# Patient Record
Sex: Male | Born: 1970 | Race: White | Hispanic: No | Marital: Married | Smoking: Never smoker
Health system: Southern US, Community
[De-identification: ages and names within clinical notes are randomized; demographics above are authoritative.]

## PROBLEM LIST (undated history)

## (undated) DIAGNOSIS — Z8719 Personal history of other diseases of the digestive system: Secondary | ICD-10-CM

## (undated) DIAGNOSIS — K219 Gastro-esophageal reflux disease without esophagitis: Secondary | ICD-10-CM

---

## 2006-11-04 ENCOUNTER — Emergency Department (HOSPITAL_COMMUNITY): Admission: EM | Admit: 2006-11-04 | Discharge: 2006-11-04 | Payer: Self-pay | Admitting: Emergency Medicine

## 2010-05-04 ENCOUNTER — Encounter: Admission: RE | Admit: 2010-05-04 | Discharge: 2010-05-04 | Payer: Self-pay | Admitting: Family Medicine

## 2013-07-07 ENCOUNTER — Observation Stay (HOSPITAL_COMMUNITY)
Admission: EM | Admit: 2013-07-07 | Discharge: 2013-07-08 | Disposition: A | Discharge status: Home / Self Care | Payer: Worker's Compensation | Attending: Orthopedic Surgery | Admitting: Orthopedic Surgery

## 2013-07-07 ENCOUNTER — Encounter (HOSPITAL_COMMUNITY): Payer: Worker's Compensation | Admitting: Anesthesiology

## 2013-07-07 ENCOUNTER — Encounter: Payer: Self-pay | Admitting: Family Medicine

## 2013-07-07 ENCOUNTER — Encounter (HOSPITAL_COMMUNITY): Payer: Self-pay | Admitting: Emergency Medicine

## 2013-07-07 ENCOUNTER — Ambulatory Visit (INDEPENDENT_AMBULATORY_CARE_PROVIDER_SITE_OTHER): Payer: BC Managed Care – PPO | Admitting: Family Medicine

## 2013-07-07 ENCOUNTER — Emergency Department (HOSPITAL_COMMUNITY): Payer: Worker's Compensation | Admitting: Anesthesiology

## 2013-07-07 ENCOUNTER — Encounter (HOSPITAL_COMMUNITY): Admission: EM | Disposition: A | Discharge status: Home / Self Care | Payer: Self-pay | Source: Home / Self Care

## 2013-07-07 ENCOUNTER — Ambulatory Visit: Payer: BC Managed Care – PPO

## 2013-07-07 VITALS — BP 146/88 | HR 86 | Temp 98.6°F | Resp 18 | Ht 74.0 in | Wt 372.0 lb

## 2013-07-07 DIAGNOSIS — S62609B Fracture of unspecified phalanx of unspecified finger, initial encounter for open fracture: Secondary | ICD-10-CM

## 2013-07-07 DIAGNOSIS — T148XXA Other injury of unspecified body region, initial encounter: Secondary | ICD-10-CM | POA: Insufficient documentation

## 2013-07-07 DIAGNOSIS — S6292XB Unspecified fracture of left wrist and hand, initial encounter for open fracture: Secondary | ICD-10-CM

## 2013-07-07 DIAGNOSIS — IMO0002 Reserved for concepts with insufficient information to code with codable children: Principal | ICD-10-CM | POA: Insufficient documentation

## 2013-07-07 DIAGNOSIS — W208XXA Other cause of strike by thrown, projected or falling object, initial encounter: Secondary | ICD-10-CM | POA: Insufficient documentation

## 2013-07-07 DIAGNOSIS — Z23 Encounter for immunization: Secondary | ICD-10-CM

## 2013-07-07 DIAGNOSIS — S61409A Unspecified open wound of unspecified hand, initial encounter: Secondary | ICD-10-CM | POA: Insufficient documentation

## 2013-07-07 DIAGNOSIS — K219 Gastro-esophageal reflux disease without esophagitis: Secondary | ICD-10-CM | POA: Insufficient documentation

## 2013-07-07 DIAGNOSIS — S6722XA Crushing injury of left hand, initial encounter: Secondary | ICD-10-CM

## 2013-07-07 DIAGNOSIS — K449 Diaphragmatic hernia without obstruction or gangrene: Secondary | ICD-10-CM | POA: Insufficient documentation

## 2013-07-07 DIAGNOSIS — S6720XA Crushing injury of unspecified hand, initial encounter: Secondary | ICD-10-CM

## 2013-07-07 HISTORY — DX: Gastro-esophageal reflux disease without esophagitis: K21.9

## 2013-07-07 HISTORY — DX: Personal history of other diseases of the digestive system: Z87.19

## 2013-07-07 HISTORY — PX: PERCUTANEOUS PINNING: SHX2209

## 2013-07-07 HISTORY — PX: I&D EXTREMITY: SHX5045

## 2013-07-07 SURGERY — PINNING, EXTREMITY, PERCUTANEOUS
Anesthesia: General | Site: Hand | Laterality: Left | Wound class: Dirty or Infected

## 2013-07-07 MED ORDER — VITAMIN C 500 MG PO TABS
1000.0000 mg | ORAL_TABLET | Freq: Every day | ORAL | Status: DC
  Administered 2013-07-07 – 2013-07-08 (×2): 1000 mg via ORAL
  Filled 2013-07-07 (×2): qty 2

## 2013-07-07 MED ORDER — CEFAZOLIN SODIUM 1-5 GM-% IV SOLN
INTRAVENOUS | Status: AC
  Filled 2013-07-07: qty 100

## 2013-07-07 MED ORDER — PANTOPRAZOLE SODIUM 40 MG PO TBEC: 40.0000 mg | DELAYED_RELEASE_TABLET | Freq: Two times a day (BID) | ORAL | Status: DC | PRN

## 2013-07-07 MED ORDER — SENNA 8.6 MG PO TABS
1.0000 | ORAL_TABLET | Freq: Two times a day (BID) | ORAL | Status: DC
  Administered 2013-07-07 – 2013-07-08 (×2): 8.6 mg via ORAL
  Filled 2013-07-07 (×3): qty 1

## 2013-07-07 MED ORDER — METHOCARBAMOL 500 MG PO TABS
ORAL_TABLET | ORAL | Status: AC
  Filled 2013-07-07: qty 1

## 2013-07-07 MED ORDER — ONDANSETRON HCL 4 MG PO TABS: 4.0000 mg | ORAL_TABLET | Freq: Four times a day (QID) | ORAL | Status: DC | PRN

## 2013-07-07 MED ORDER — LACTATED RINGERS IV SOLN
INTRAVENOUS | Status: DC
  Administered 2013-07-07: 20:00:00 via INTRAVENOUS

## 2013-07-07 MED ORDER — DIPHENHYDRAMINE HCL 25 MG PO CAPS: 25.0000 mg | ORAL_CAPSULE | Freq: Four times a day (QID) | ORAL | Status: DC | PRN

## 2013-07-07 MED ORDER — CEFAZOLIN SODIUM-DEXTROSE 2-3 GM-% IV SOLR
INTRAVENOUS | Status: DC | PRN
  Administered 2013-07-07: 2 g via INTRAVENOUS

## 2013-07-07 MED ORDER — LACTATED RINGERS IV SOLN
INTRAVENOUS | Status: DC | PRN
  Administered 2013-07-07: 18:00:00 via INTRAVENOUS

## 2013-07-07 MED ORDER — OXYCODONE HCL 5 MG PO TABS
ORAL_TABLET | ORAL | Status: AC
  Filled 2013-07-07: qty 1

## 2013-07-07 MED ORDER — MIDAZOLAM HCL 5 MG/5ML IJ SOLN
INTRAMUSCULAR | Status: DC | PRN
  Administered 2013-07-07: 2 mg via INTRAVENOUS

## 2013-07-07 MED ORDER — ONDANSETRON HCL 4 MG/2ML IJ SOLN: 4.0000 mg | Freq: Four times a day (QID) | INTRAMUSCULAR | Status: DC | PRN

## 2013-07-07 MED ORDER — LIDOCAINE HCL (CARDIAC) 20 MG/ML IV SOLN
INTRAVENOUS | Status: DC | PRN
  Administered 2013-07-07: 100 mg via INTRAVENOUS

## 2013-07-07 MED ORDER — ONDANSETRON HCL 4 MG/2ML IJ SOLN
INTRAMUSCULAR | Status: DC | PRN
  Administered 2013-07-07: 4 mg via INTRAVENOUS

## 2013-07-07 MED ORDER — METHOCARBAMOL 100 MG/ML IJ SOLN
500.0000 mg | Freq: Four times a day (QID) | INTRAVENOUS | Status: DC | PRN
  Filled 2013-07-07: qty 5

## 2013-07-07 MED ORDER — CEFAZOLIN SODIUM 1-5 GM-% IV SOLN
1.0000 g | INTRAVENOUS | Status: DC
  Administered 2013-07-07: 1 g via INTRAVENOUS

## 2013-07-07 MED ORDER — OXYCODONE HCL 5 MG PO TABS
5.0000 mg | ORAL_TABLET | ORAL | Status: DC | PRN
  Administered 2013-07-07: 5 mg via ORAL
  Administered 2013-07-08 (×3): 10 mg via ORAL
  Filled 2013-07-07 (×3): qty 2

## 2013-07-07 MED ORDER — PROPOFOL 10 MG/ML IV BOLUS
INTRAVENOUS | Status: DC | PRN
  Administered 2013-07-07: 100 mg via INTRAVENOUS
  Administered 2013-07-07: 200 mg via INTRAVENOUS

## 2013-07-07 MED ORDER — FENTANYL CITRATE 0.05 MG/ML IJ SOLN
INTRAMUSCULAR | Status: DC | PRN
  Administered 2013-07-07: 50 ug via INTRAVENOUS
  Administered 2013-07-07: 25 ug via INTRAVENOUS
  Administered 2013-07-07: 50 ug via INTRAVENOUS
  Administered 2013-07-07: 75 ug via INTRAVENOUS
  Administered 2013-07-07: 50 ug via INTRAVENOUS

## 2013-07-07 MED ORDER — SODIUM CHLORIDE 0.9 % IR SOLN
Status: DC | PRN
  Administered 2013-07-07: 1000 mL

## 2013-07-07 MED ORDER — HYDROMORPHONE HCL PF 1 MG/ML IJ SOLN
INTRAMUSCULAR | Status: AC
  Filled 2013-07-07: qty 1

## 2013-07-07 MED ORDER — MORPHINE SULFATE 2 MG/ML IJ SOLN
1.0000 mg | INTRAMUSCULAR | Status: DC | PRN
  Administered 2013-07-07: 1 mg via INTRAVENOUS
  Filled 2013-07-07: qty 1

## 2013-07-07 MED ORDER — METHOCARBAMOL 500 MG PO TABS
500.0000 mg | ORAL_TABLET | Freq: Four times a day (QID) | ORAL | Status: DC | PRN
  Administered 2013-07-07 – 2013-07-08 (×2): 500 mg via ORAL
  Filled 2013-07-07: qty 1

## 2013-07-07 MED ORDER — HYDROMORPHONE HCL PF 1 MG/ML IJ SOLN
0.2500 mg | INTRAMUSCULAR | Status: DC | PRN
  Administered 2013-07-07 (×4): 0.5 mg via INTRAVENOUS

## 2013-07-07 MED ORDER — LACTATED RINGERS IV SOLN
INTRAVENOUS | Status: DC
  Administered 2013-07-07: 18:00:00 via INTRAVENOUS

## 2013-07-07 MED ORDER — TEMAZEPAM 15 MG PO CAPS: 15.0000 mg | ORAL_CAPSULE | Freq: Every evening | ORAL | Status: DC | PRN

## 2013-07-07 MED ORDER — HYDROMORPHONE HCL PF 1 MG/ML IJ SOLN
INTRAMUSCULAR | Status: AC
Start: 2013-07-07 — End: 2013-07-08
  Filled 2013-07-07: qty 1

## 2013-07-07 MED ORDER — ALPRAZOLAM 0.5 MG PO TABS: 0.5000 mg | ORAL_TABLET | Freq: Four times a day (QID) | ORAL | Status: DC | PRN

## 2013-07-07 MED ORDER — CEFAZOLIN SODIUM 1-5 GM-% IV SOLN
1.0000 g | Freq: Three times a day (TID) | INTRAVENOUS | Status: DC
  Administered 2013-07-08 (×2): 1 g via INTRAVENOUS
  Filled 2013-07-07 (×6): qty 50

## 2013-07-07 MED ORDER — SODIUM CHLORIDE 0.9 % IR SOLN
Status: DC | PRN
  Administered 2013-07-07: 4000 mL

## 2013-07-07 SURGICAL SUPPLY — 61 items
BANDAGE CONFORM 2  STR LF (GAUZE/BANDAGES/DRESSINGS) IMPLANT
BANDAGE CONFORM 3  STR LF (GAUZE/BANDAGES/DRESSINGS) ×2 IMPLANT
BANDAGE ELASTIC 3 VELCRO ST LF (GAUZE/BANDAGES/DRESSINGS) IMPLANT
BANDAGE ELASTIC 4 VELCRO ST LF (GAUZE/BANDAGES/DRESSINGS) ×2 IMPLANT
BANDAGE GAUZE ELAST BULKY 4 IN (GAUZE/BANDAGES/DRESSINGS) ×2 IMPLANT
BENZOIN TINCTURE PRP APPL 2/3 (GAUZE/BANDAGES/DRESSINGS) IMPLANT
BLADE SURG ROTATE 9660 (MISCELLANEOUS) IMPLANT
CLOTH BEACON ORANGE TIMEOUT ST (SAFETY) IMPLANT
CORDS BIPOLAR (ELECTRODE) ×2 IMPLANT
COVER SURGICAL LIGHT HANDLE (MISCELLANEOUS) ×2 IMPLANT
CUFF TOURNIQUET SINGLE 18IN (TOURNIQUET CUFF) IMPLANT
CUFF TOURNIQUET SINGLE 24IN (TOURNIQUET CUFF) ×2 IMPLANT
DRAPE OEC MINIVIEW 54X84 (DRAPES) ×2 IMPLANT
DRSG ADAPTIC 3X8 NADH LF (GAUZE/BANDAGES/DRESSINGS) ×2 IMPLANT
DRSG EMULSION OIL 3X3 NADH (GAUZE/BANDAGES/DRESSINGS) IMPLANT
ELECT REM PT RETURN 9FT ADLT (ELECTROSURGICAL)
ELECTRODE REM PT RTRN 9FT ADLT (ELECTROSURGICAL) IMPLANT
GAUZE XEROFORM 1X8 LF (GAUZE/BANDAGES/DRESSINGS) IMPLANT
GAUZE XEROFORM 5X9 LF (GAUZE/BANDAGES/DRESSINGS) ×2 IMPLANT
GLOVE BIOGEL M STRL SZ7.5 (GLOVE) ×2 IMPLANT
GLOVE BIOGEL PI IND STRL 7.0 (GLOVE) ×1 IMPLANT
GLOVE BIOGEL PI INDICATOR 7.0 (GLOVE) ×1
GLOVE ECLIPSE 6.5 STRL STRAW (GLOVE) ×2 IMPLANT
GLOVE SS BIOGEL STRL SZ 8 (GLOVE) ×1 IMPLANT
GLOVE SUPERSENSE BIOGEL SZ 8 (GLOVE) ×1
GOWN PREVENTION PLUS XLARGE (GOWN DISPOSABLE) IMPLANT
GOWN STRL NON-REIN LRG LVL3 (GOWN DISPOSABLE) IMPLANT
GOWN STRL REIN XL XLG (GOWN DISPOSABLE) ×4 IMPLANT
HANDPIECE INTERPULSE COAX TIP (DISPOSABLE)
IV NS 1000ML (IV SOLUTION) ×4
IV NS 1000ML BAXH (IV SOLUTION) ×4 IMPLANT
K-WIRE DBL TROCAR .045X4 ×4 IMPLANT
KIT BASIN OR (CUSTOM PROCEDURE TRAY) ×2 IMPLANT
KIT ROOM TURNOVER OR (KITS) ×2 IMPLANT
KWIRE DBL TROCAR .045X4 ×2 IMPLANT
MANIFOLD NEPTUNE II (INSTRUMENTS) IMPLANT
NEEDLE HYPO 25GX1X1/2 BEV (NEEDLE) IMPLANT
NS IRRIG 1000ML POUR BTL (IV SOLUTION) ×2 IMPLANT
PACK ORTHO EXTREMITY (CUSTOM PROCEDURE TRAY) ×2 IMPLANT
PAD ARMBOARD 7.5X6 YLW CONV (MISCELLANEOUS) ×4 IMPLANT
PAD CAST 4YDX4 CTTN HI CHSV (CAST SUPPLIES) ×1 IMPLANT
PADDING CAST COTTON 4X4 STRL (CAST SUPPLIES) ×1
SET CYSTO W/LG BORE CLAMP LF (SET/KITS/TRAYS/PACK) ×2 IMPLANT
SET HNDPC FAN SPRY TIP SCT (DISPOSABLE) IMPLANT
SPONGE GAUZE 4X4 12PLY (GAUZE/BANDAGES/DRESSINGS) ×2 IMPLANT
SPONGE LAP 18X18 X RAY DECT (DISPOSABLE) IMPLANT
SPONGE LAP 4X18 X RAY DECT (DISPOSABLE) IMPLANT
STRIP CLOSURE SKIN 1/2X4 (GAUZE/BANDAGES/DRESSINGS) IMPLANT
SUT CHROMIC 4 0 P 3 18 (SUTURE) ×2 IMPLANT
SUT CHROMIC 5 0 P 3 (SUTURE) ×2 IMPLANT
SUT ETHILON 4 0 P 3 18 (SUTURE) IMPLANT
SUT ETHILON 5 0 P 3 18 (SUTURE)
SUT NYLON ETHILON 5-0 P-3 1X18 (SUTURE) IMPLANT
SUT PROLENE 4 0 P 3 18 (SUTURE) ×2 IMPLANT
SYR CONTROL 10ML LL (SYRINGE) IMPLANT
TOWEL OR 17X24 6PK STRL BLUE (TOWEL DISPOSABLE) ×2 IMPLANT
TOWEL OR 17X26 10 PK STRL BLUE (TOWEL DISPOSABLE) ×2 IMPLANT
TUBE ANAEROBIC SPECIMEN COL (MISCELLANEOUS) IMPLANT
TUBE CONNECTING 12X1/4 (SUCTIONS) ×2 IMPLANT
WATER STERILE IRR 1000ML POUR (IV SOLUTION) ×2 IMPLANT
YANKAUER SUCT BULB TIP NO VENT (SUCTIONS) ×2 IMPLANT

## 2013-07-07 NOTE — Anesthesia Postprocedure Evaluation (Signed)
Anesthesia Post Note  Patient: Kirk Miles  Procedure(s) Performed: Procedure(s) (LRB): Irrigation and Debridement Left Small Finger with Pinning and Possible ORIF (Left) IRRIGATION AND DEBRIDEMENT EXTREMITY (Left)  Anesthesia type: general  Patient location: PACU  Post pain: Pain level controlled  Post assessment: Patient's Cardiovascular Status Stable  Last Vitals:  Filed Vitals:   07/07/13 2015  BP: 138/76  Pulse: 86  Temp:   Resp: 13    Post vital signs: Reviewed and stable  Level of consciousness: sedated  Complications: No apparent anesthesia complications

## 2013-07-07 NOTE — Anesthesia Procedure Notes (Signed)
Procedure Name: LMA Insertion Date/Time: 07/07/2013 6:46 PM Performed by: Molli Hazard Pre-anesthesia Checklist: Patient identified, Emergency Drugs available, Suction available and Patient being monitored Patient Re-evaluated:Patient Re-evaluated prior to inductionOxygen Delivery Method: Circle system utilized Preoxygenation: Pre-oxygenation with 100% oxygen Intubation Type: IV induction LMA: LMA inserted LMA Size: 5.0 Number of attempts: 1 Placement Confirmation: positive ETCO2 Tube secured with: Tape Dental Injury: Teeth and Oropharynx as per pre-operative assessment

## 2013-07-07 NOTE — Anesthesia Preprocedure Evaluation (Addendum)
Anesthesia Evaluation  Patient identified by MRN, date of birth, ID band Patient awake    Reviewed: Allergy & Precautions, H&P , NPO status , Patient's Chart, lab work & pertinent test results  Airway Mallampati: II TM Distance: >3 FB Neck ROM: Full    Dental  (+) Dental Advisory Given and Teeth Intact   Pulmonary neg pulmonary ROS,  breath sounds clear to auscultation        Cardiovascular negative cardio ROS  Rhythm:Regular Rate:Normal     Neuro/Psych    GI/Hepatic Neg liver ROS, hiatal hernia, GERD-  ,  Endo/Other  negative endocrine ROS  Renal/GU negative Renal ROS     Musculoskeletal   Abdominal   Peds  Hematology   Anesthesia Other Findings   Reproductive/Obstetrics                          Anesthesia Physical Anesthesia Plan  ASA: II  Anesthesia Plan: General   Post-op Pain Management:    Induction: Intravenous  Airway Management Planned: Oral ETT and LMA  Additional Equipment:   Intra-op Plan:   Post-operative Plan: Extubation in OR  Informed Consent: I have reviewed the patients History and Physical, chart, labs and discussed the procedure including the risks, benefits and alternatives for the proposed anesthesia with the patient or authorized representative who has indicated his/her understanding and acceptance.   Dental advisory given  Plan Discussed with: CRNA and Anesthesiologist  Anesthesia Plan Comments:        Anesthesia Quick Evaluation

## 2013-07-07 NOTE — Transfer of Care (Signed)
Immediate Anesthesia Transfer of Care Note  Patient: Kirk Miles  Procedure(s) Performed: Procedure(s): Irrigation and Debridement Left Small Finger with Pinning and Possible ORIF (Left) IRRIGATION AND DEBRIDEMENT EXTREMITY (Left)  Patient Location: PACU  Anesthesia Type:General  Level of Consciousness: awake, alert  and oriented  Airway & Oxygen Therapy: Patient connected to nasal cannula oxygen  Post-op Assessment: Report given to PACU RN, Post -op Vital signs reviewed and stable and Patient moving all extremities X 4  Post vital signs: Reviewed and stable  Complications: No apparent anesthesia complications

## 2013-07-07 NOTE — Patient Instructions (Signed)
Dr. Amanda Pea and or his PA will see you in the emergency room at [place].

## 2013-07-07 NOTE — H&P (Signed)
Kirk Miles is an 42 y.o. male.   Chief Complaint: Comminuted crushing injury to left small finger HPI: Patient presents status post crush injury left small finger with open fracture pattern. The patient had tremendous blood loss and skin tearing. He has been numb but prior to his emergency visit and the administration of anesthetic he states he can feel the finger.  He notes no other injury. This was not job injury. The a patient states a computer his hands he was trying to lift a box. He notes no other injury. His delightful gentleman. I've asked to see him before in the office 10 years ago for a issue on his opposite extremity.  Kirk Miles.Patient presents for evaluation and treatment of the of their upper extremity predicament. The patient denies neck back chest or of abdominal pain. The patient notes that they have no lower extremity problems. The patient from primarily complains of the upper extremity pain noted.  Past Medical History  Diagnosis Date  . GERD (gastroesophageal reflux disease)   . H/O hiatal hernia     History reviewed. No pertinent past surgical history.  No family history on file. Social History:  reports that he has never smoked. He does not have any smokeless tobacco history on file. He reports that he does not drink alcohol. His drug history is not on file.  Allergies: No Known Allergies  Medications Prior to Admission  Medication Sig Dispense Refill  . ibuprofen (ADVIL,MOTRIN) 200 MG tablet Take 400 mg by mouth every 6 (six) hours as needed for moderate pain.      Kirk Miles omeprazole (PRILOSEC) 20 MG capsule Take 20 mg by mouth daily.        No results found for this or any previous visit (from the past 48 hour(s)). Dg Hand Complete Left  07/07/2013   CLINICAL DATA:  Hand crushed between 2 computers  EXAM: LEFT HAND - COMPLETE 3+ VIEW  COMPARISON:  None.  FINDINGS: Obliquely oriented and a mildly comminuted fracture through the base of the proximal phalanx of the little  finger. And nondisplaced fracture line extends to the proximal articular surface. The remainder the visualized bones and joints are intact and unremarkable. There is associated soft tissue swelling.  IMPRESSION: Mildly comminuted and displaced fracture through the base the of the proximal phalanx of the little finger with intra-articular extension.   Electronically Signed   By: Malachy Moan M.D.   On: 07/07/2013 15:25    Review of Systems  Constitutional: Negative.   HENT: Negative.   Eyes: Negative.   Respiratory: Negative.   Cardiovascular: Negative.   Gastrointestinal: Negative.   Genitourinary: Negative.   Neurological: Negative.   Endo/Heme/Allergies: Negative.   Psychiatric/Behavioral: Negative.     Blood pressure 151/86, pulse 100, temperature 98.6 F (37 C), temperature source Oral, resp. rate 18, SpO2 96.00%. Physical Exam   laceration fourth webspace left hand with fracture about the proximal phalanx left small finger. FDP is intact. His finger feels numb secondary to administration of local anesthetic by the emergency room department. The patient has intact refill. He is no other injury to the wrist forearm or elbow. His right upper extremity is neurovascular intact with IV access.  His x-rays are reviewed which show a comminuted fracture about the left small finger proximal phalanx  .Kirk KitchenThe patient is alert and oriented in no acute distress the patient complains of pain in the affected upper extremity.  The patient is noted to have a normal HEENT exam.  Lung fields  show equal chest expansion and no shortness of breath  abdomen exam is nontender without distention.  Lower extremity examination does not show any fracture dislocation or blood clot symptoms.  Pelvis is stable neck and back are stable and nontender Assessment/Plan DISPLACED OPEN LEFT SMALL FINGER PROXIMAL PHALANX FRACTURE. WE WILL PLAN IRRIGATION DEBRIDEMENT REPAIR IS NECESSARY. HE UNDERSTANDS RISK AND  BENEFITS AND DESIRES TO PROCEED.   Kirk Miles.We are planning surgery for your upper extremity. The risk and benefits of surgery include risk of bleeding infection anesthesia damage to normal structures and failure of the surgery to accomplish its intended goals of relieving symptoms and restoring function with this in mind we'll going to proceed. I have specifically discussed with the patient the pre-and postoperative regime and the does and don'ts and risk and benefits in great detail. Risk and benefits of surgery also include risk of dystrophy chronic nerve pain failure of the healing process to go onto completion and other inherent risks of surgery The relavent the pathophysiology of the disease/injury process, as well as the alternatives for treatment and postoperative course of action has been discussed in great detail with the patient who desires to proceed.  We will do everything in our power to help you (the patient) restore function to the upper extremity. Is a pleasure to see this patient today.   Karen Chafe 07/07/2013, 6:19 PM

## 2013-07-07 NOTE — Progress Notes (Signed)
Health Hx. Discussed with Dr. Randa Evens, no labs needed.

## 2013-07-07 NOTE — ED Notes (Signed)
Pt has laceration to left hand.  He went to [facility] and was going to get sutured but it was found that he also has a fx so he was sent here for further evaluation.  BLeeding controlled at this time

## 2013-07-07 NOTE — ED Notes (Signed)
Pt transported to OR

## 2013-07-07 NOTE — Op Note (Signed)
See dictation #[id number] Wendall Stade Md

## 2013-07-07 NOTE — Preoperative (Signed)
Beta Blockers   Reason not to administer Beta Blockers:Not Applicable 

## 2013-07-07 NOTE — Progress Notes (Signed)
Subjective: Patient was lifting a computer out of a BN of old computers and another heavy computer fell on his left hand, crushing it. He is able to work it loose, but had a bad tear of the tissue between the fourth and fifth fingers in the web space. He is overdue for a tetanus shot.  Generally he has been a healthy person. He knows he is overweight, otherwise no major health concerns.  Objective: Large white male in some discomfort. Flexion extension of the left fifth finger is good with good strength. However he is very painful in the area of the wound. He has a split between the fourth and fifth fingers down in the webspace. It appears that he is almost one finger off to get that wound.  Ring was removed from the fourth finger.   The wound was anesthetized with a metacarpal block to the finger, and he was sent for x-ray.  [facility] reading (PRIMARY) by  Dr. Alwyn Ren Impacted and displaced fracture of proximal first phalynx of fifth finger, probably involving joint..  Assessment: Open fracture proximal first phalanx of fifth finger Hand pain  Plan: Reached Dr. Amanda Pea through an operating room nurse. The patient will be set to cut emergency room.

## 2013-07-07 NOTE — ED Provider Notes (Signed)
CSN: [id number]     Arrival date & time 07/07/13  1536 History  This chart was scribed for non-physician practitioner working [name], [place],  with Derwood Kaplan, MD by Ronal Fear, ED scribe. This patient was seen in room TR06C/TR06C and the patient's care was started at 4:15 PM.    Chief Complaint  Patient presents with  . Extremity Laceration   Patient is a 42 y.o. male presenting with skin laceration. The history is provided by the patient. No language interpreter was used.  Laceration Location:  Hand Hand laceration location:  L hand Length (cm):  3 Bleeding: controlled   Laceration mechanism:  Blunt object Pain details:    Quality:  Unable to specify   Timing:  Constant Relieved by:  Nothing Worsened by:  Movement Tetanus status:  Up to date   HPI Comments: Kirk Miles is a 42 y.o. male who presents to the Emergency Department complaining of a left handed 5 th finger laceration and fracture. He was seen at [facility] and was told that he fractured his 5th finger on his left hand.  Pt was out picking up some computer equipment and while lifting the computer another fell onto the 5 th finger of his left hand bending it backwards and cutting into it. Given an open fracture sent here. Digital block administered at UC. Tetanus updated. Dr. Amanda Pea was consulted, will see here.    History reviewed. No pertinent past medical history. History reviewed. No pertinent past surgical history. No family history on file. History  Substance Use Topics  . Smoking status: Never Smoker   . Smokeless tobacco: Not on file  . Alcohol Use: No    Review of Systems  Skin: Positive for wound.  All other systems reviewed and are negative.    Allergies  Review of patient's allergies indicates no known allergies.  Home Medications   Current Outpatient Rx  Name  Route  Sig  Dispense  Refill  . omeprazole (PRILOSEC) 20 MG capsule   Oral   Take 20 mg by mouth daily.           BP 151/86  Pulse 100  Temp(Src) 98.6 F (37 C) (Oral)  Resp 18  SpO2 96% Physical Exam  Nursing note and vitals reviewed. Constitutional: He is oriented to person, place, and time. He appears well-developed and well-nourished. No distress.  HENT:  Head: Normocephalic and atraumatic.  Eyes: EOM are normal.  Neck: Neck supple. No tracheal deviation present.  Cardiovascular: Normal rate.   Pulmonary/Chest: Effort normal. No respiratory distress.  Musculoskeletal: Normal range of motion.  3cm laceration at palmar surface of his left 5th MCP joint. Hemastatic. Tender to palpation. Pain with any ROM of the 5th finger at MCP joint. Finger appears normal distally with normal cap refill. Decreased sensation at this time possibly due to a digital block.   Neurological: He is alert and oriented to person, place, and time.  Skin: Skin is warm and dry.  Psychiatric: He has a normal mood and affect. His behavior is normal.    ED Course  Procedures (including critical care time) DIAGNOSTIC STUDIES: Oxygen Saturation is 96% on RA, adequate by my interpretation.    COORDINATION OF CARE:    4:19 PM- Pt advised of plan for treatment inclduing ice pack for pain and referral to in house hand surgeon and pt agrees.   Labs Review Labs Reviewed - No data to display Imaging Review Dg Hand Complete Left  07/07/2013  CLINICAL DATA:  Hand crushed between 2 computers  EXAM: LEFT HAND - COMPLETE 3+ VIEW  COMPARISON:  None.  FINDINGS: Obliquely oriented and a mildly comminuted fracture through the base of the proximal phalanx of the little finger. And nondisplaced fracture line extends to the proximal articular surface. The remainder the visualized bones and joints are intact and unremarkable. There is associated soft tissue swelling.  IMPRESSION: Mildly comminuted and displaced fracture through the base the of the proximal phalanx of the little finger with intra-articular extension.    Electronically Signed   By: Malachy Moan M.D.   On: 07/07/2013 15:25    EKG Interpretation   None       MDM   1. Open finger fracture, initial encounter     Patient taken to lower by Dr. Amanda Pea for man of his open fracture.  Filed Vitals:   07/07/13 2015 07/07/13 2030 07/07/13 2106 07/07/13 2131  BP: 138/76 128/78  154/92  Pulse: 86 87  92  Temp:  97 F (36.1 C)  97.4 F (36.3 C)  TempSrc:    Oral  Resp: 13 13  16   SpO2: 100% 95% 100% 100%   I personally performed the services described in this documentation, which was scribed in my presence. The recorded information has been reviewed and is accurate.     Lottie Mussel, PA-C 07/08/13 [phone]

## 2013-07-08 MED ORDER — METHOCARBAMOL 500 MG PO TABS: 500.0000 mg | ORAL_TABLET | Freq: Four times a day (QID) | ORAL | Status: AC | PRN

## 2013-07-08 MED ORDER — CEPHALEXIN 500 MG PO CAPS: 500.0000 mg | ORAL_CAPSULE | Freq: Four times a day (QID) | ORAL | Status: AC

## 2013-07-08 MED ORDER — OXYCODONE HCL 5 MG PO TABS: 5.0000 mg | ORAL_TABLET | ORAL | Status: AC | PRN

## 2013-07-08 NOTE — Progress Notes (Signed)
PT Cancellation Note  Patient Details Name: Kirk Miles MRN: [id number] DOB: June 19, 1971   Cancelled Treatment:    Reason Eval/Treat Not Completed: PT screened, no needs identified, will sign off   Van Clines [facility] 07/08/2013, 10:52 AM

## 2013-07-08 NOTE — Discharge Summary (Signed)
Physician Discharge Summary  Patient ID: Kirk Miles MRN: [id number] DOB/AGE: 10/24/1970 42 y.o.  Admit date: 07/07/2013 Discharge date: 07/08/2013  Admission Diagnoses: Left small finger proximal phalanx fracture, open nature with displacement  Discharge Diagnoses: Same improved   Discharged Condition: Stable  Hospital Course: The patient is a pleasant 42 year old male who sustained an injury to the left hand yesterday large computer dropped hitting his hand. He had noticed immediate pain deformity and a laceration about the fourth webspace he presented to the emergency room setting was noted to have findings consistent with an open fracture about the left small finger proximal phalanx. Hand surgery was consulted for evaluation and treatment. We discussed with the patient his findings and the open nature of his injury as well as degree of displacement. Recommendations for surgical intervention were implemented. The patient was taken to the operative suite where he underwent irrigation debridement and pinning of the open fracture about the left small finger proximal phalanx. He tolerated the procedure there were no complications. Please see operative details.  He was admitted to the orthopedic unit for close observation, IV antibiotics and pain management. He did well over the evening and on postoperative day #1 the patient was awake sitting in a chair he finished eating lunch without difficulties. He was noted to have a fair amount of pain but this was improved with oral pain medications. We discussed with him all issues and per mutual agreement decided on discharge postoperative day #1. Patient was tolerating a regular diet without difficulties and voiding without difficulties  Consults: None  Treatments: Irrigation and debridement left small finger open proximal phalanx fracture with open reduction and pinning  Discharge Exam: Blood pressure 129/69, pulse 77, temperature 98.7 F (37.1  C), temperature source Oral, resp. rate 16, SpO2 97.00%. [name].The patient is alert and oriented in no acute distress the patient complains of pain in the affected upper extremity.  The patient is noted to have a normal HEENT exam.  Lung fields show equal chest expansion and no shortness of breath  abdomen exam is nontender without distention.  Lower extremity examination does not show any fracture dislocation or blood clot symptoms.  Pelvis is stable neck and back are stable and nontender  evaluation of her left upper extremity shows that his splint is clean and intact. Sensation and refill are intact to the digits. No signs of infection are present  Disposition: Final discharge disposition not confirmed  Discharge Orders   Future Orders Complete By Expires   Call MD / Call 911  As directed    Comments:     If you experience chest pain or shortness of breath, CALL 911 and be transported to the hospital emergency room.  If you develope a fever above 101 F, pus (white drainage) or increased drainage or redness at the wound, or calf pain, call your surgeon's office.   Constipation Prevention  As directed    Comments:     Drink plenty of fluids.  Prune juice may be helpful.  You may use a stool softener, such as Colace (over the counter) 100 mg twice a day.  Use MiraLax (over the counter) for constipation as needed.   Diet - low sodium heart healthy  As directed    Discharge instructions  As directed    Comments:     Marland KitchenMarland KitchenKeep bandage clean and dry.  Call for any problems.  No smoking.  Criteria for driving a car: you should be off your pain medicine for 7-8  hours, able to drive one handed(confident), thinking clearly and feeling able in your judgement to drive. Continue elevation as it will decrease swelling.  If instructed by MD move your fingers within the confines of the bandage/splint.  Use ice if instructed by your MD. Call immediately for any sudden loss of feeling in your hand/arm or change in  functional abilities of the extremity. [name].We recommend that you to take vitamin C 1000 mg a day to promote healing we also recommend that if you require her pain medicine that he take a stool softener to prevent constipation as most pain medicines will have constipation side effects. We recommend either Peri-Colace or Senokot and recommend that you also consider adding MiraLAX to prevent the constipation affects from pain medicine if you are required to use them. These medicines are over the counter and maybe purchased at a local pharmacy.   Increase activity slowly as tolerated  As directed    Sling  As directed        Medication List         cephALEXin 500 MG capsule  Commonly known as:  KEFLEX  Take 1 capsule (500 mg total) by mouth 4 (four) times daily.     ibuprofen 200 MG tablet  Commonly known as:  ADVIL,MOTRIN  Take 400 mg by mouth every 6 (six) hours as needed for moderate pain.     methocarbamol 500 MG tablet  Commonly known as:  ROBAXIN  Take 1 tablet (500 mg total) by mouth every 6 (six) hours as needed for muscle spasms.     omeprazole 20 MG capsule  Commonly known as:  PRILOSEC  Take 20 mg by mouth daily.     oxyCODONE 5 MG immediate release tablet  Commonly known as:  Oxy IR/ROXICODONE  Take 1 tablet (5 mg total) by mouth every 3 (three) hours as needed for moderate pain.           Follow-up Information   Follow up with Karen Chafe, MD In 7 days. (our office will call you with appt date and time)    Specialty:  Orthopedic Surgery   Contact information:   [address] [place] [address] [phone]       Signed: Sheran Lawless 07/08/2013, 1:30 PM

## 2013-07-08 NOTE — Op Note (Signed)
Kirk Miles, Kirk Miles          ACCOUNT NO.:  [id number]  MEDICAL RECORD NO.:  [id number]  LOCATION:  [id number]                        FACILITY:  [facility]  PHYSICIAN:  Dionne Ano. Kamera Dubas, M.D.DATE OF BIRTH:  [birth year]  DATE OF PROCEDURE: DATE OF DISCHARGE:  07/08/2013                              OPERATIVE REPORT   PREOPERATIVE DIAGNOSIS:  Open left small finger proximal phalanx fracture with laceration about the 4th web space.  POSTOPERATIVE DIAGNOSIS:  Open left small finger proximal phalanx fracture with laceration about the 4th web space.  PROCEDURE: 1. I and D, open fractures skin subcutaneous tissue, bone, and     periosteum as well as paratenon tendon, this was an excisional     debridement. 2. Exploration neurovascular bundle with neurolysis of the radial     digital nerve to the left small finger. 3. Open reduction and internal fixation proximal phalanx fracture left     small finger with Kirschner wire fixation. 4. Stress radiography. 5. Complex 4th web space repair reconstruction, left hand.  SURGEON:  Dionne Ano. Amanda Pea, M.D.  ASSISTANT:  None.  COMPLICATION:  None.  ANESTHESIA:  General.  TOURNIQUET TIME:  Zero.  INDICATIONS:  This patient is a 42 year old male, who presents with the above-mentioned diagnosis.  I have counseled him in regards to risks and benefits of surgery and desires to proceed with above-mentioned operative intervention.  This was an on-the-job injury when a computer crushed his finger and he sustained a resultant open fracture.  I have counseled him regards to the treatment and care algorithm.  With all issues in mind he desires to proceed.  OPERATIVE PROCEDURE:  The patient was seen by myself and Anesthesia, taken to the operative suite, underwent smooth induction of general anesthesia, laid supine, appropriately padded, prepped and draped in a usual sterile fashion, Betadine scrub and paint. Time-out called.  Pre and postop check was  complete.  Arm was I and D with knife, curette, and scissor tip.  This was a excisional debridement of an open fracture about the proximal phalanx.  I placed 4-5 L of saline in this region aggressively.  Following the I and D, I then performed a neurolysis of the radial digital nerve and exploration of the neurovascular bundle, which was intact.  Neurolysis was accomplished without difficulty.  Following this under live stress radiography/fluoro, I reduced the fracture and then placed a Kirschner wire fixation in the form of a Eaton-Belsky technique.  This lined up reasonably well despite the comminuted nature, I was pleased with the position.  I am going to watch this closely as he does have the potential to fragment, but nevertheless I was pleased with the fixation overall.  At this time, I irrigated copiously again and closed the wound with modified chromic suture given the web space involvement.  I very carefully placed Adaptic, Xeroform, and gauze followed by Volar fiberglass splint.  I have discussed all issues with his wife on the phone after the surgery and before surgery of course.  He tolerated this well.  Given the patient's open injury, I am going to go ahead and keep him overnight for IV antibiotics.  We will monitor his condition closely.  We will see him  back in the office in approximately 12 days after discharge and begin some range of motion with therapy at 2 weeks.  I get a the AP lateral x- rays, if x-rays looked good we will go ahead and embark on some early range of motion.  These notes have been discussed and all questions have been encouraged and answered.     Dionne Ano. Amanda Pea, M.D.     [facility]  D:  07/07/2013  T:  07/08/2013  Job:  [id number]

## 2013-07-08 NOTE — Progress Notes (Signed)
UR COMPLETED  

## 2013-07-08 NOTE — Evaluation (Signed)
Occupational Therapy Evaluation Patient Details Name: Kirk Miles MRN: [id number] DOB: March 31, 1971 Today's Date: 07/08/2013 Time: 7253-6644 OT Time Calculation (min): 31 min  OT Assessment / Plan / Recommendation History of present illness s/p I & D and pinning of L small finger following crush accident at work   Clinical Impression   Pt educated in one handed techniques for self care, covering L hand for showering, elevation of L UE in bed and chair with at least 3 pillows, and in AROM of L shoulder and elbow.  Pt is moving well, no PT needs identified and communicated this to physical therapist assigned.  No further OT needs.    OT Assessment  Patient does not need any further OT services    Follow Up Recommendations  No OT follow up    Barriers to Discharge      Equipment Recommendations  None recommended by OT    Recommendations for Other Services    Frequency       Precautions / Restrictions Precautions Precautions: None   Pertinent Vitals/Pain 3/10, iced, elevated L UE, pt declining pain meds, but encouraged him to stay ahead of the pain    ADL  Eating/Feeding: Independent Where Assessed - Eating/Feeding: Chair Grooming: Wash/dry hands;Modified independent Where Assessed - Grooming: Unsupported standing Upper Body Bathing: Minimal assistance Where Assessed - Upper Body Bathing: Unsupported sitting Lower Body Bathing: Modified independent Where Assessed - Lower Body Bathing: Unsupported standing Upper Body Dressing: Modified independent Where Assessed - Upper Body Dressing: Unsupported sitting Lower Body Dressing: Modified independent Where Assessed - Lower Body Dressing: Unsupported sitting;Unsupported standing Toilet Transfer: Community education officer Method: Sit to Barista: Comfort height toilet Toileting - Clothing Manipulation and Hygiene: Independent Where Assessed - Toileting Clothing Manipulation and Hygiene: Sit to stand  from 3-in-1 or toilet Transfers/Ambulation Related to ADLs: Independent mobility in room/bathroom. ADL Comments: Instructed in hemi techiques and edema management in bed and in chair.    OT Diagnosis:    OT Problem List:   OT Treatment Interventions:     OT Goals(Current goals can be found in the care plan section) Acute Rehab OT Goals Patient Stated Goal: Return to work.  Visit Information  Last OT Received On: 07/08/13 History of Present Illness: s/p I & D and pinning of L small finger following crush accident at work       Prior Functioning     Home Living Family/patient expects to be discharged to:: Private residence Living Arrangements: Spouse/significant other Available Help at Discharge: Family;Available PRN/intermittently Prior Function Level of Independence: Independent Communication Communication: No difficulties         Vision/Perception Vision - History Patient Visual Report: No change from baseline   Cognition  Cognition Arousal/Alertness: Awake/alert Behavior During Therapy: WFL for tasks assessed/performed Overall Cognitive Status: Within Functional Limits for tasks assessed    Extremity/Trunk Assessment Upper Extremity Assessment Upper Extremity Assessment: LUE deficits/detail LUE Deficits / Details: L UE splinted from DIPs to mid forearm, dulled light touch to tip of small finger. strength WNL L shoulder and elbow LUE Coordination: decreased fine motor Lower Extremity Assessment Lower Extremity Assessment: Overall WFL for tasks assessed     Mobility Bed Mobility Bed Mobility: Sit to Supine Sit to Supine: 6: Modified independent (Device/Increase time);HOB elevated Transfers Transfers: Sit to Stand;Stand to Sit Sit to Stand: 7: Independent;Without upper extremity assist Stand to Sit: 7: Independent;Without upper extremity assist     Exercise     Balance  End of Session OT - End of Session Activity Tolerance: Patient tolerated  treatment well Patient left: in chair;with call bell/phone within reach Nurse Communication: Mobility status (OT eval completed)  GO Functional Assessment Tool Used: clinical judgement Functional Limitation: Self care Self Care Current Status (Z6109): At least 1 percent but less than 20 percent impaired, limited or restricted Self Care Goal Status (U0454): At least 1 percent but less than 20 percent impaired, limited or restricted Self Care Discharge Status [phone]): At least 1 percent but less than 20 percent impaired, limited or restricted   Evern Bio 07/08/2013, 9:51 AM [phone]

## 2013-07-09 ENCOUNTER — Encounter (HOSPITAL_COMMUNITY): Payer: Self-pay | Admitting: Orthopedic Surgery

## 2013-07-14 NOTE — ED Provider Notes (Signed)
Medical screening examination/treatment/procedure(s) were performed by non-physician practitioner and as supervising physician I was immediately available for consultation/collaboration.  EKG Interpretation   None        Derwood Kaplan, MD 07/14/13 [phone]

## 2013-07-14 NOTE — ED Provider Notes (Signed)
Medical screening examination/treatment/procedure(s) were performed by non-physician practitioner and as supervising physician I was immediately available for consultation/collaboration.  EKG Interpretation   None        Karsin Pesta, MD 07/14/13 0325 

## 2015-05-04 IMAGING — CR DG HAND COMPLETE 3+V*L*
3 series · 3 of 3 positions shown · non-contrast
Comparison: None.

CLINICAL DATA: Hand crushed between 2 computers

EXAM:
LEFT HAND - COMPLETE 3+ VIEW

[PA]
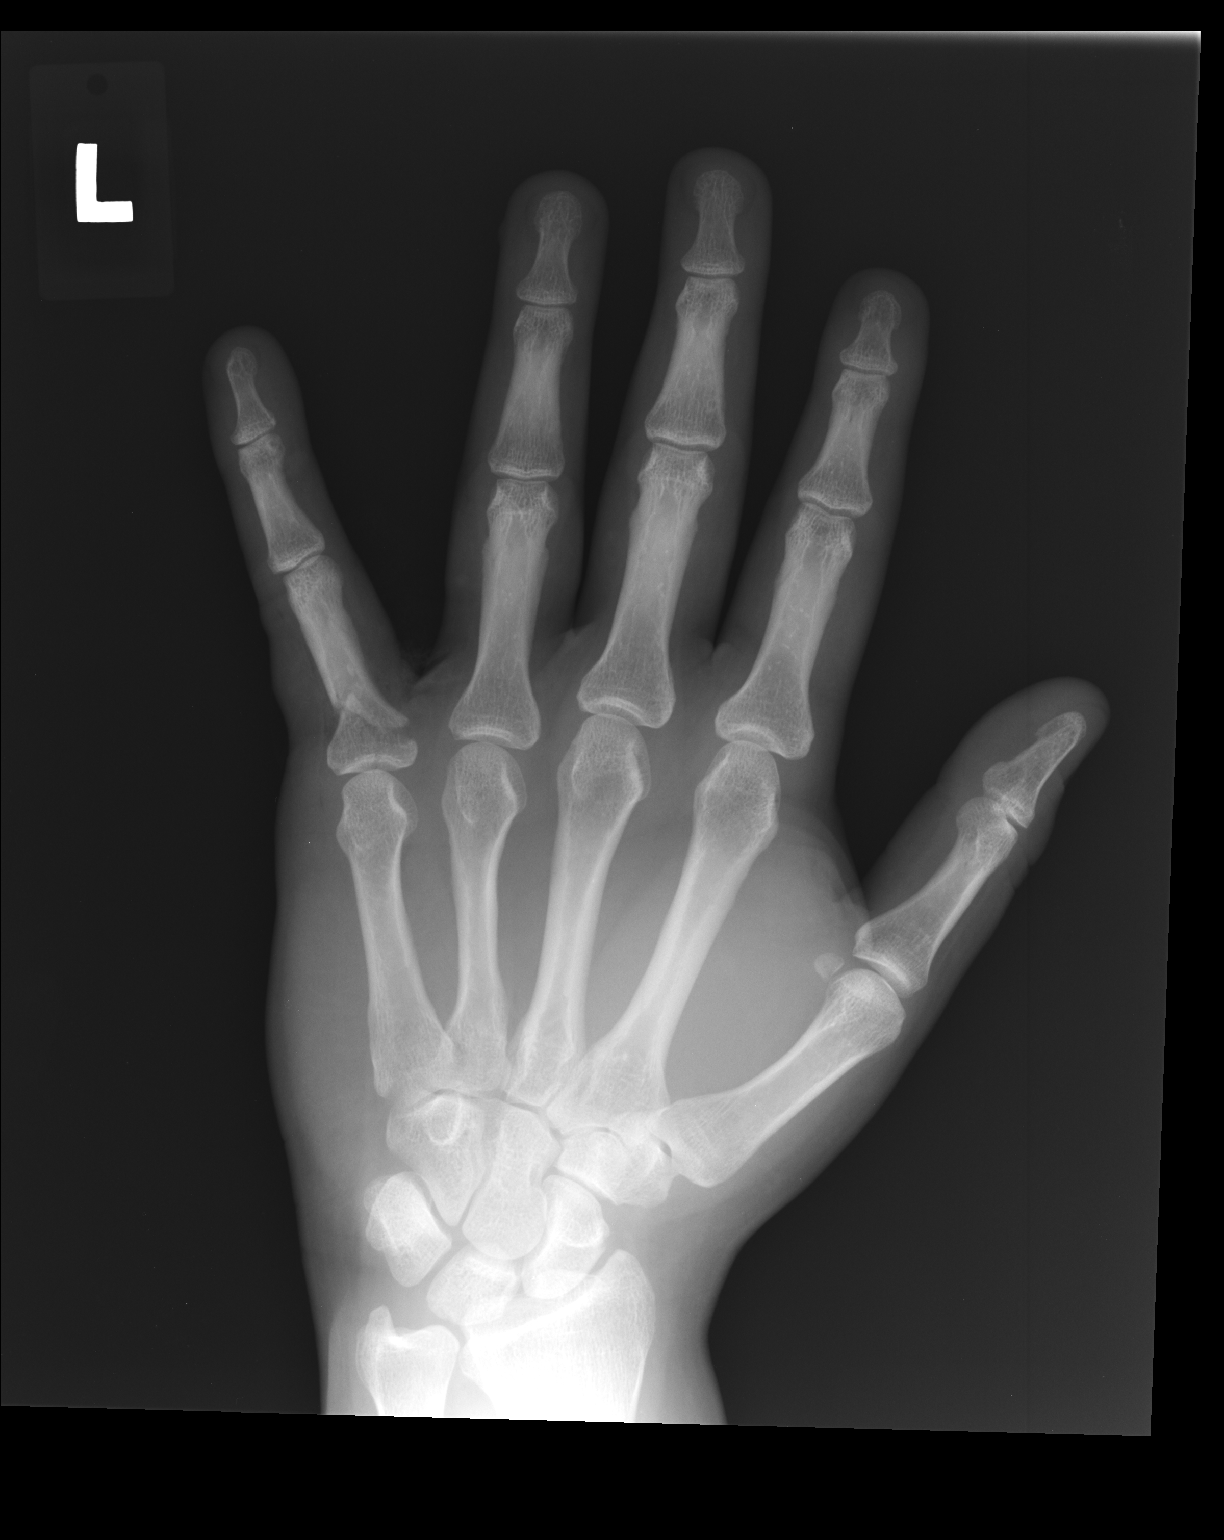

[lateral]
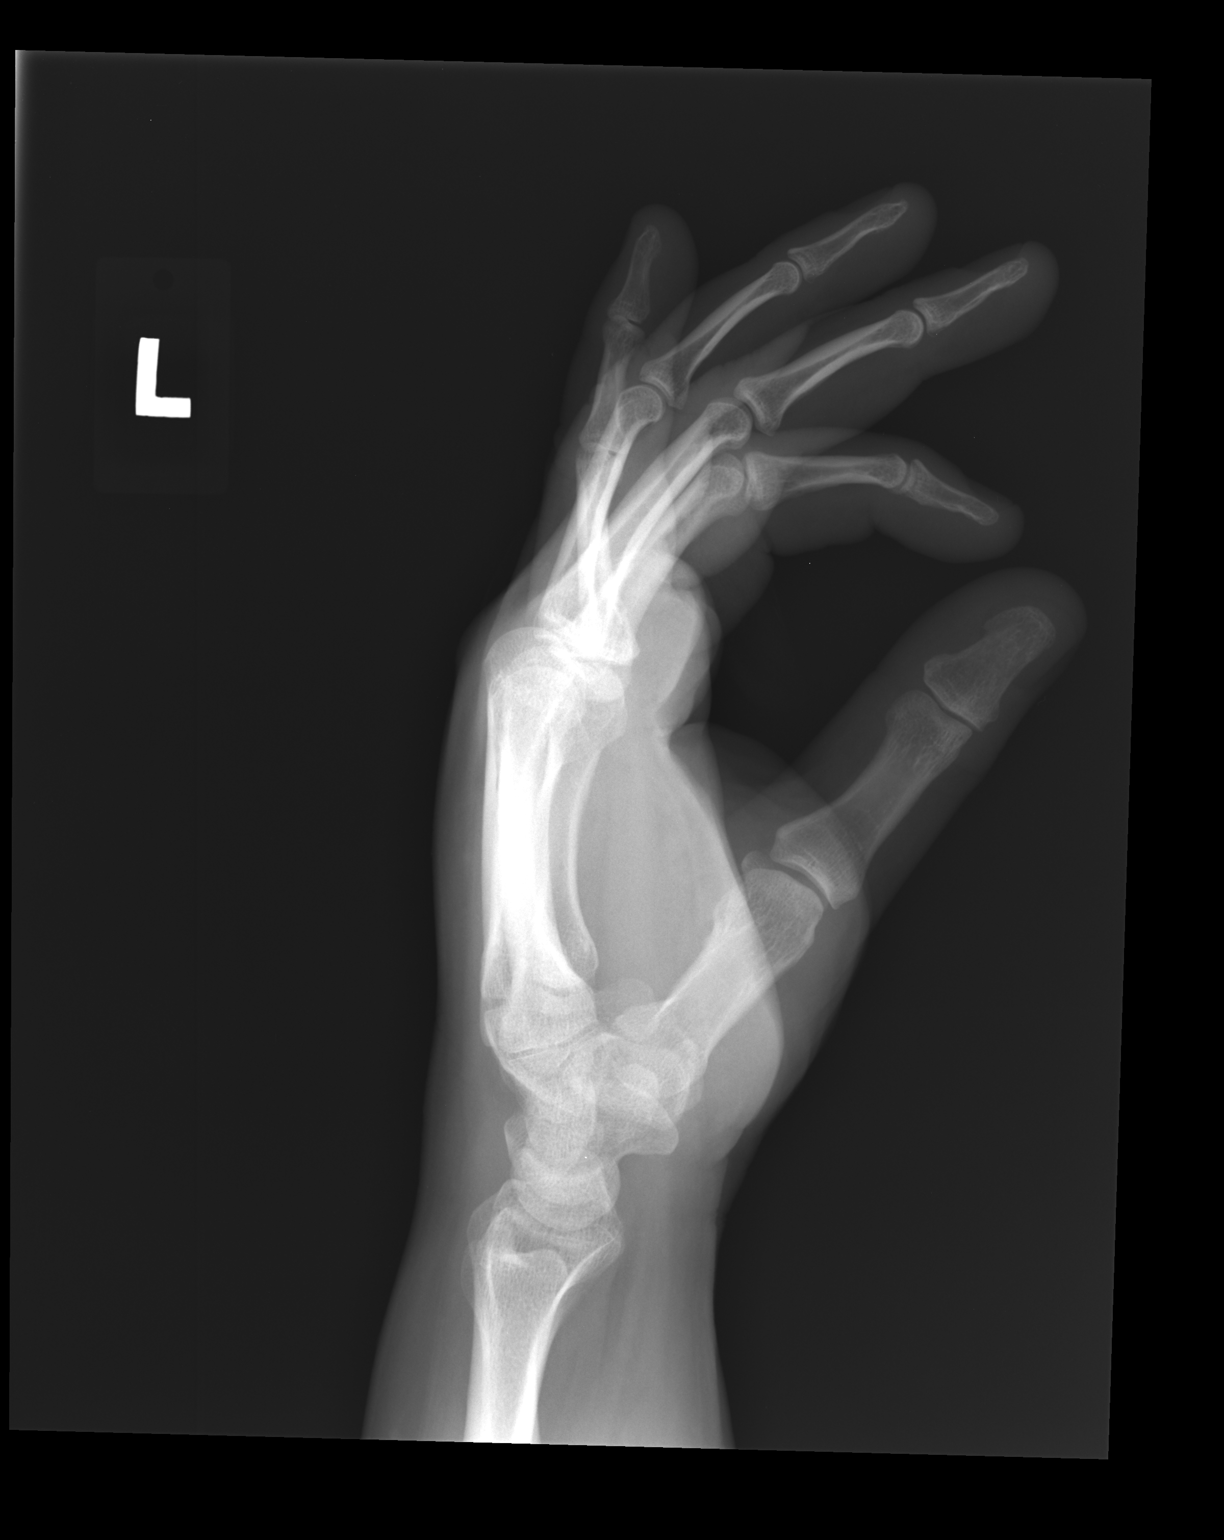

[pa obl]
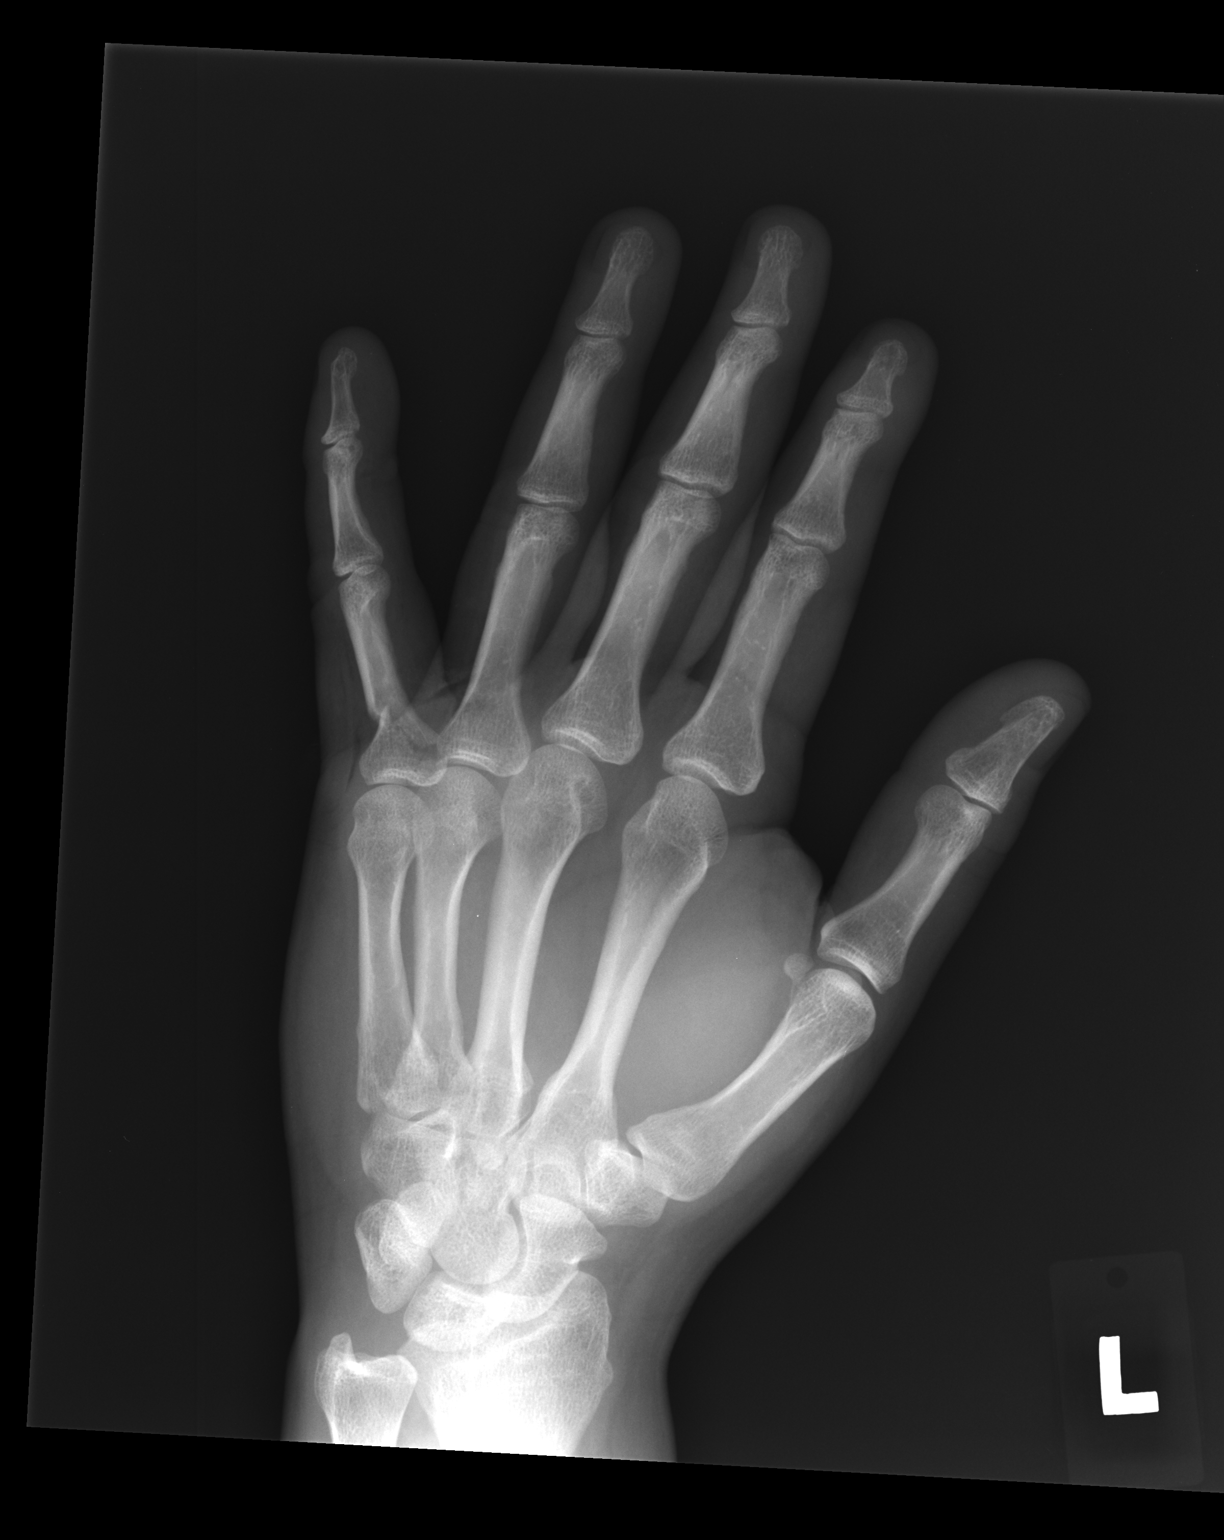

[3 of 3 positions shown; findings below may reference images not displayed]

FINDINGS: Obliquely oriented and a mildly comminuted fracture through the base
of the proximal phalanx of the little finger. And nondisplaced
fracture line extends to the proximal articular surface. The
remainder the visualized bones and joints are intact and
unremarkable. There is associated soft tissue swelling.
IMPRESSION: Mildly comminuted and displaced fracture through the base the of the
proximal phalanx of the little finger with intra-articular
extension.

## 2019-11-13 ENCOUNTER — Ambulatory Visit: Payer: BC Managed Care – PPO | Attending: Internal Medicine

## 2019-11-13 DIAGNOSIS — Z23 Encounter for immunization: Secondary | ICD-10-CM

## 2019-11-13 NOTE — Progress Notes (Signed)
   Covid-19 Vaccination Clinic  Name:  Kairyn Olmeda    MRN: [id number] DOB: October 30, 1970  11/13/2019  Mr. Mcniel was observed post Covid-19 immunization for 30 without incident. He was provided with Vaccine Information Sheet and instruction to access the V-Safe system.   Mr. Hayashida was instructed to call 911 with any severe reactions post vaccine: [name] Difficulty breathing  . Swelling of face and throat  . A fast heartbeat  . A bad rash all over body  . Dizziness and weakness   Immunizations Administered    Name Date Dose VIS Date Route   Pfizer COVID-19 Vaccine 11/13/2019  9:21 AM 0.3 mL 08/14/2019 Intramuscular   Manufacturer: [employer], [employer]   Lot: [id number]   NDC: [address]-7

## 2019-12-08 ENCOUNTER — Ambulatory Visit: Payer: BC Managed Care – PPO | Attending: Internal Medicine

## 2019-12-08 DIAGNOSIS — Z23 Encounter for immunization: Secondary | ICD-10-CM

## 2019-12-08 NOTE — Progress Notes (Signed)
   Covid-19 Vaccination Clinic  Name:  Waverly Chavarria    MRN: [id number] DOB: [birth year]  12/08/2019  Mr. Mutchler was observed post Covid-19 immunization for 15 minutes without incident. He was provided with Vaccine Information Sheet and instruction to access the V-Safe system.   Mr. Schaberg was instructed to call 911 with any severe reactions post vaccine: [name] Difficulty breathing  . Swelling of face and throat  . A fast heartbeat  . A bad rash all over body  . Dizziness and weakness   Immunizations Administered    Name Date Dose VIS Date Route   Pfizer COVID-19 Vaccine 12/08/2019 10:15 AM 0.3 mL 08/14/2019 Intramuscular   Manufacturer: [employer], [employer]   Lot: [id number]   NDC: [address]-8
